# Patient Record
Sex: Female | Born: 1970 | Race: White | Hispanic: No | Marital: Married | Smoking: Former smoker
Health system: Southern US, Community
[De-identification: ages and names within clinical notes are randomized; demographics above are authoritative.]

## PROBLEM LIST (undated history)

## (undated) DIAGNOSIS — I10 Essential (primary) hypertension: Secondary | ICD-10-CM

## (undated) HISTORY — DX: Essential (primary) hypertension: I10

## (undated) HISTORY — PX: OTHER SURGICAL HISTORY: SHX169

---

## 2003-09-17 ENCOUNTER — Other Ambulatory Visit: Admission: RE | Admit: 2003-09-17 | Discharge: 2003-09-17 | Payer: Self-pay | Admitting: Obstetrics and Gynecology

## 2004-12-17 ENCOUNTER — Other Ambulatory Visit: Admission: RE | Admit: 2004-12-17 | Discharge: 2004-12-17 | Payer: Self-pay | Admitting: Obstetrics and Gynecology

## 2010-04-13 ENCOUNTER — Encounter: Admission: RE | Admit: 2010-04-13 | Discharge: 2010-04-13 | Payer: Self-pay | Admitting: Family Medicine

## 2012-11-27 ENCOUNTER — Other Ambulatory Visit: Payer: Self-pay | Admitting: Family Medicine

## 2012-11-27 DIAGNOSIS — Z1231 Encounter for screening mammogram for malignant neoplasm of breast: Secondary | ICD-10-CM

## 2012-12-12 ENCOUNTER — Ambulatory Visit
Admission: RE | Admit: 2012-12-12 | Discharge: 2012-12-12 | Disposition: A | Discharge status: Home / Self Care | Payer: PRIVATE HEALTH INSURANCE | Source: Ambulatory Visit | Attending: Family Medicine | Admitting: Family Medicine

## 2012-12-12 DIAGNOSIS — Z1231 Encounter for screening mammogram for malignant neoplasm of breast: Secondary | ICD-10-CM

## 2013-11-22 ENCOUNTER — Encounter: Payer: Self-pay | Admitting: Internal Medicine

## 2013-11-22 NOTE — Progress Notes (Signed)
This encounter was created in error - please disregard.

## 2017-05-24 ENCOUNTER — Ambulatory Visit: Payer: PRIVATE HEALTH INSURANCE | Admitting: Cardiovascular Disease

## 2017-05-24 ENCOUNTER — Other Ambulatory Visit: Payer: Self-pay | Admitting: Registered Nurse

## 2017-05-24 ENCOUNTER — Telehealth: Payer: Self-pay | Admitting: Student

## 2017-05-24 DIAGNOSIS — Z1231 Encounter for screening mammogram for malignant neoplasm of breast: Secondary | ICD-10-CM

## 2017-05-24 NOTE — Telephone Encounter (Signed)
Received records from [facility] ([place] for appointment on 06/07/17 with Randall An, PA.  Records put with Brittany's schedule for 06/07/17. lp

## 2017-05-30 ENCOUNTER — Encounter: Payer: Self-pay | Admitting: *Deleted

## 2017-06-06 ENCOUNTER — Ambulatory Visit
Admission: RE | Admit: 2017-06-06 | Discharge: 2017-06-06 | Disposition: A | Discharge status: Home / Self Care | Payer: BLUE CROSS/BLUE SHIELD | Source: Ambulatory Visit | Attending: Registered Nurse | Admitting: Registered Nurse

## 2017-06-06 DIAGNOSIS — Z1231 Encounter for screening mammogram for malignant neoplasm of breast: Secondary | ICD-10-CM

## 2017-06-06 NOTE — Progress Notes (Deleted)
Cardiology Office Note    Date:  06/06/2017   ID:  Carly Diaz, DOB Nov 29, 1970, MRN [id number]  PCP:  Merri Brunette, MD  Cardiologist: New to [facility] - Dr. Marland Kitchen  No chief complaint on file.   History of Present Illness:    Carly Diaz is a 46 y.o. female with past medical history of *** who presents to the office today for evaluation of *** at the request of ***.     No past medical history on file.  No past surgical history on file.  Current Medications: Outpatient Medications Prior to Visit  Medication Sig Dispense Refill  . Apple Cider Vinegar 600 MG CAPS Take 600 mg by mouth daily.    . Biotin 1000 MCG tablet Take 1,000 mcg by mouth daily.    . Cinnamon 500 MG capsule Take 500 mg by mouth daily.    . diphenhydrAMINE (BENADRYL) 25 MG tablet Take 25 mg by mouth every 6 (six) hours as needed.    Marland Kitchen ibuprofen (ADVIL,MOTRIN) 200 MG tablet Take 200 mg by mouth every 6 (six) hours as needed for fever, headache, mild pain, moderate pain or cramping.     No facility-administered medications prior to visit.      Allergies:   Aspirin   Social History   Social History  . Marital status: Unknown    Spouse name: N/A  . Number of children: N/A  . Years of education: N/A   Social History Main Topics  . Smoking status: Not on file  . Smokeless tobacco: Never Used  . Alcohol use Not on file  . Drug use: Unknown  . Sexual activity: Not on file   Other Topics Concern  . Not on file   Social History Narrative  . No narrative on file     Family History:  The patient's ***family history includes Heart attack in her father; Heart disease in her father.   Review of Systems:   Please see the history of present illness.     General:  No chills, fever, night sweats or weight changes.  Cardiovascular:  No chest pain, dyspnea on exertion, edema, orthopnea, palpitations, paroxysmal nocturnal dyspnea. Dermatological: No rash, lesions/masses Respiratory: No cough,  dyspnea Urologic: No hematuria, dysuria Abdominal:   No nausea, vomiting, diarrhea, bright red blood per rectum, melena, or hematemesis Neurologic:  No visual changes, wkns, changes in mental status. All other systems reviewed and are otherwise negative except as noted above.   Physical Exam:    VS:  There were no vitals taken for this visit.   General: Well developed, well nourished,female appearing in no acute distress. Head: Normocephalic, atraumatic, sclera non-icteric, no xanthomas, nares are without discharge.  Neck: No carotid bruits. JVD not elevated.  Lungs: Respirations regular and unlabored, without wheezes or rales.  Heart: ***Regular rate and rhythm. No S3 or S4.  No murmur, no rubs, or gallops appreciated. Abdomen: Soft, non-tender, non-distended with normoactive bowel sounds. No hepatomegaly. No rebound/guarding. No obvious abdominal masses. Msk:  Strength and tone appear normal for age. No joint deformities or effusions. Extremities: No clubbing or cyanosis. No edema.  Distal pedal pulses are 2+ bilaterally. Neuro: Alert and oriented X 3. Moves all extremities spontaneously. No focal deficits noted. Psych:  Responds to questions appropriately with a normal affect. Skin: No rashes or lesions noted  Wt Readings from Last 3 Encounters:  No data found for Wt        Studies/Labs Reviewed:   EKG:  EKG is*** ordered today.  The ekg ordered today demonstrates ***  Recent Labs: No results found for requested labs within last 8760 hours.   Lipid Panel No results found for: CHOL, TRIG, HDL, CHOLHDL, VLDL, LDLCALC, LDLDIRECT  Additional studies/ records that were reviewed today include:  ***  Assessment:    No diagnosis found.   Plan:   In order of problems listed above:  1. ***    Medication Adjustments/Labs and Tests Ordered: Current medicines are reviewed at length with the patient today.  Concerns regarding medicines are outlined above.  Medication  changes, Labs and Tests ordered today are listed in the Patient Instructions below. There are no Patient Instructions on file for this visit.   Signed, Ellsworth Lennox, PA-C  06/06/2017 9:25 PM    [facility] [address] [place], [address] [place], [place]  [address] Phone: [phone]; Fax: [phone]  [address], [address] [place], [place] [address] Phone: [phone]

## 2017-06-07 ENCOUNTER — Ambulatory Visit: Payer: PRIVATE HEALTH INSURANCE | Admitting: Student

## 2017-07-20 ENCOUNTER — Telehealth: Payer: Self-pay | Admitting: Cardiology

## 2017-07-20 NOTE — Telephone Encounter (Signed)
[facility] for upcoming appointment on 08/03/17 @ 9:40am with Dr. Herbie BaltimoreHarding. [facility]. 07/20/17 ab

## 2017-08-03 ENCOUNTER — Ambulatory Visit: Payer: BLUE CROSS/BLUE SHIELD | Admitting: Cardiology

## 2017-08-29 ENCOUNTER — Ambulatory Visit: Payer: 59 | Admitting: Cardiology

## 2017-08-29 ENCOUNTER — Encounter: Payer: Self-pay | Admitting: Cardiology

## 2017-08-29 VITALS — BP 158/102 | HR 67 | Ht 69.0 in | Wt 139.8 lb

## 2017-08-29 DIAGNOSIS — R002 Palpitations: Secondary | ICD-10-CM

## 2017-08-29 DIAGNOSIS — I493 Ventricular premature depolarization: Secondary | ICD-10-CM | POA: Diagnosis not present

## 2017-08-29 DIAGNOSIS — I10 Essential (primary) hypertension: Secondary | ICD-10-CM | POA: Diagnosis not present

## 2017-08-29 DIAGNOSIS — Z8249 Family history of ischemic heart disease and other diseases of the circulatory system: Secondary | ICD-10-CM

## 2017-08-29 DIAGNOSIS — R012 Other cardiac sounds: Secondary | ICD-10-CM

## 2017-08-29 NOTE — Progress Notes (Signed)
PCP: Merri BrunettePharr, Walter, MD; Lauretta ChesterMary Prevost, NP  Clinic Note: Chief Complaint  Patient presents with  . New Patient (Initial Visit)    Family history of CAD    HPI: Carly Diaz is a 47 y.o. female who is being seen today for the evaluation of cardiovascular risk at the request of Lauretta ChesterMary Prevost, NP /  Merri BrunettePharr, Walter, MD.  EKG from Dr. Carolee RotaPharr's office May 20, 2017 -- showed sinus rhythm with frequent PVCs.  Unifocal.  Cannot exclude right atrial enlargement.  Delayed R wave progression with nonspecific ST changes.  Carly Diaz was seen back in Nov by Lauretta ChesterMary Prevost, NP -- doing well, but concern re abnormal EKG & fam Hx CAD.   Recent Hospitalizations: None  Studies Personally Reviewed - (if available, images/films reviewed: From Epic Chart or Care Everywhere)  None  Interval History: Carly Diaz presents here today basically to discuss her strong family history of heart disease with her father having an MI at roughly this age resulting in sudden cardiac death.  She also notes that he was a heavy smoker, and she does not smoke.  He was also overweight and not reactive.  She is quite active. She carries a diagnosis of hypertension and is somewhat hypertensive today.  She takes metoprolol succinate but no other medications besides over-the-counter meds. From a cardiac standpoint overall she is quite stable relatively active.  She denies any chest tightness or pressure with rest or exertion.  No exertional dyspnea.  No PND, orthopnea or edema. No rapid irregular heartbeats or palpitations.  No lightheadedness, dizziness, weakness or syncope/near syncope. No TIA/amaurosis fugax symptoms. No melena, hematochezia, hematuria, or epstaxis. No claudication.  ROS: A comprehensive was performed. Review of Systems  Constitutional: Negative for malaise/fatigue.  HENT: Negative for congestion and nosebleeds.   Respiratory: Negative for cough and wheezing.   Gastrointestinal: Negative for  abdominal pain, blood in stool and melena.  Genitourinary: Negative for dysuria and hematuria.  Musculoskeletal: Negative for falls and joint pain.  Skin: Negative.   Neurological: Negative for dizziness.  Endo/Heme/Allergies: Negative for environmental allergies. Does not bruise/bleed easily.  Psychiatric/Behavioral: Negative for depression and memory loss. The patient is not nervous/anxious and does not have insomnia.   All other systems reviewed and are negative.   I have reviewed and (if needed) personally updated the patient's problem list, medications, allergies, past medical and surgical history, social and family history.   Past Medical History:  Diagnosis Date  . Essential hypertension     Past Surgical History:  Procedure Laterality Date  . None      Current Meds  Medication Sig  . Apple Cider Vinegar 600 MG CAPS Take 600 mg by mouth daily.  . Biotin 1000 MCG tablet Take 1,000 mcg by mouth daily.  . Cinnamon 500 MG capsule Take 500 mg by mouth daily.  . diphenhydrAMINE (BENADRYL) 25 MG tablet Take 25 mg by mouth every 6 (six) hours as needed.  . Doxylamine Succinate, Sleep, (SLEEP AID PO) Take 25 mg by mouth at bedtime.  Marland Kitchen. ibuprofen (ADVIL,MOTRIN) 200 MG tablet Take 200 mg by mouth every 6 (six) hours as needed for fever, headache, mild pain, moderate pain or cramping.  . Metoprolol Succinate 50 MG CS24 Take 1 tablet by mouth daily.    Allergies  Allergen Reactions  . Aspirin     GI upset    Social History   Tobacco Use  . Smoking status: Former Games developermoker  . Smokeless tobacco: Never Used  Substance Use Topics  . Alcohol use: Yes    Alcohol/week: 2.4 - 3.0 oz    Types: 4 - 5 Cans of beer per week    Comment: 3 days a week  . Drug use: No   Social History   Social History Narrative  . Not on file    family history includes Heart attack (age of onset: 44) in her father; Heart disease in her father.  Wt Readings from Last 3 Encounters:  08/29/17 139 lb  12.8 oz (63.4 kg)    PHYSICAL EXAM BP (!) 158/102   Pulse 67   Ht 5\' 9"  (1.753 m)   Wt 139 lb 12.8 oz (63.4 kg)   BMI 20.64 kg/m  Physical Exam  Constitutional: She is oriented to person, place, and time. She appears well-developed and well-nourished. No distress.  Very healthy-appearing.  Neck: Normal range of motion. Neck supple. No hepatojugular reflux and no JVD present. Carotid bruit is not present.  Cardiovascular: Normal rate, regular rhythm, S1 normal and intact distal pulses.  No extrasystoles are present. PMI is not displaced. Exam reveals no gallop and no friction rub.  No murmur heard. Split S2 versus opening snap..  Pulmonary/Chest: Effort normal and breath sounds normal. No respiratory distress. She has no wheezes. She has no rales.  Abdominal: Soft. Bowel sounds are normal. She exhibits no distension. There is no tenderness. There is no rebound.  Musculoskeletal: Normal range of motion. She exhibits no edema.  Neurological: She is alert and oriented to person, place, and time.  Skin: Skin is warm and dry. No erythema.  Psychiatric: She has a normal mood and affect. Her behavior is normal. Judgment and thought content normal.  Nursing note and vitals reviewed.    Adult ECG Report  Rate: 67;  Rhythm: normal sinus rhythm and Normal axis, intervals and durations.  Relatively normal R wave progression on current EKG.;   Narrative Interpretation: Stable EKG.   Other studies Reviewed: Additional studies/ records that were reviewed today include:  Recent Labs: Labs reviewed by PCPs note.  See scanned results.  (Addendum below)  ASSESSMENT / PLAN:  Carly Diaz presents here today for baseline cardiovascular evaluation.  She has a strong family history with her father having a relatively early age.  Thankfully, the only real risk factors she has not, and other than family history is hypertension and being a former smoker.   Her blood pressures not very well controlled, however  she is quite anxious today and I suspect that that is probably the reason why her blood pressure is higher.  At her PCPs office the pressure was not nearly as high being 140/76, but still not quite at goal. Her lipids are fairly well controlled for risk based on PCPs labs with an LDL of 60.  She had some irregular heart sounds on exam, so we will check a 2D echocardiogram.  Otherwise for baseline cardiovascular risk stratification, we will check a coronary calcium score to assess for extent of coronary artery disease.  This will help [place] know how aggressive we need to do with risk factor modification.  She did have some PVCs and occasional palpitations.  However the not very significant.  At this point I do not think we need to monitor.  She is on a beta-blocker and well-controlled.  Problem List Items Addressed This Visit    Abnormal heart sounds   Relevant Orders   CT CARDIAC SCORING   ECHOCARDIOGRAM COMPLETE   Essential hypertension (Chronic)  Family history of premature coronary artery disease - Primary (Chronic)   Relevant Orders   CT CARDIAC SCORING   ECHOCARDIOGRAM COMPLETE   PVC's (premature ventricular contractions)   Relevant Orders   CT CARDIAC SCORING   ECHOCARDIOGRAM COMPLETE    Other Visit Diagnoses    Palpitations       Relevant Orders   CT CARDIAC SCORING      Current medicines are reviewed at length with the patient today. (+/- concerns) none The following changes have been made:None  Patient Instructions  NO MEDICATION CHANGES   Your physician recommends that you schedule a follow-up appointment in 1 month with DR HARDING.    SCHEDULE AT [address] Your physician has requested that you have an echocardiogram. Echocardiography is a painless test that uses sound waves to create images of your heart. It provides your doctor with information about the size and shape of your heart and how well your heart's chambers and valves are working.  This procedure takes approximately one hour. There are no restrictions for this procedure.    has ordered a CT coronary calcium score. This test is done at [address].This test is not covered by insurance, There is a $150  cost.   Coronary CalciumScan A coronary calcium scan is an imaging test used to look for deposits of calcium and other fatty materials (plaques) in the inner lining of the blood vessels of the heart (coronary arteries). These deposits of calcium and plaques can partly clog and narrow the coronary arteries without producing any symptoms or warning signs. This puts a person at risk for a heart attack. This test can detect these deposits before symptoms develop. Tell a health care provider about:  Any allergies you have.  All medicines you are taking, including vitamins, herbs, eye drops, creams, and over-the-counter medicines.  Any problems you or family members have had with anesthetic medicines.  Any blood disorders you have.  Any surgeries you have had.  Any medical conditions you have.  Whether you are pregnant or may be pregnant. What are the risks? Generally, this is a safe procedure. However, problems may occur, including:  Harm to a pregnant woman and her unborn baby. This test involves the use of radiation. Radiation exposure can be dangerous to a pregnant woman and her unborn baby. If you are pregnant, you generally should not have this procedure done.  Slight increase in the risk of cancer. This is because of the radiation involved in the test. What happens before the procedure? No preparation is needed for this procedure. What happens during the procedure?  You will undress and remove any jewelry around your neck or chest.  You will put on a hospital gown.  Sticky electrodes will be placed on your chest. The electrodes will be connected to an electrocardiogram (ECG) machine to record a tracing of the electrical activity of your  heart.  A CT scanner will take pictures of your heart. During this time, you will be asked to lie still and hold your breath for 2-3 seconds while a picture of your heart is being taken. The procedure may vary among health care providers and hospitals. What happens after the procedure?  You can get dressed.  You can return to your normal activities.  It is up to you to get the results of your test. Ask your health care provider, or the department that is doing the test, when your results will be  ready. Summary  A coronary calcium scan is an imaging test used to look for deposits of calcium and other fatty materials (plaques) in the inner lining of the blood vessels of the heart (coronary arteries).  Generally, this is a safe procedure. Tell your health care provider if you are pregnant or may be pregnant.  No preparation is needed for this procedure.  A CT scanner will take pictures of your heart.  You can return to your normal activities after the scan is done. This information is not intended to replace advice given to you by your health care provider. Make sure you discuss any questions you have with your health care provider. Document Released: 02/05/2008 Document Revised: 06/28/2016 Document Reviewed: 06/28/2016 Elsevier Interactive Patient Education  2017 [employer].     Studies Ordered:   Orders Placed This Encounter  Procedures  . CT CARDIAC SCORING  . ECHOCARDIOGRAM COMPLETE      Bryan Lemma, M.D., M.S. Interventional Cardiologist   Pager # [phone] Phone # [phone] [address] [place], [place] [address]   Thank you for choosing Heartcare at [facility]!!

## 2017-08-29 NOTE — Patient Instructions (Addendum)
NO MEDICATION CHANGES   Your physician recommends that you schedule a follow-up appointment in 1 month with DR HARDING.    SCHEDULE AT [address] Your physician has requested that you have an echocardiogram. Echocardiography is a painless test that uses sound waves to create images of your heart. It provides your doctor with information about the size and shape of your heart and how well your heart's chambers and valves are working. This procedure takes approximately one hour. There are no restrictions for this procedure.    has ordered a CT coronary calcium score. This test is done at [address].This test is not covered by insurance, There is a $150  cost.   Coronary CalciumScan A coronary calcium scan is an imaging test used to look for deposits of calcium and other fatty materials (plaques) in the inner lining of the blood vessels of the heart (coronary arteries). These deposits of calcium and plaques can partly clog and narrow the coronary arteries without producing any symptoms or warning signs. This puts a person at risk for a heart attack. This test can detect these deposits before symptoms develop. Tell a health care provider about:  Any allergies you have.  All medicines you are taking, including vitamins, herbs, eye drops, creams, and over-the-counter medicines.  Any problems you or family members have had with anesthetic medicines.  Any blood disorders you have.  Any surgeries you have had.  Any medical conditions you have.  Whether you are pregnant or may be pregnant. What are the risks? Generally, this is a safe procedure. However, problems may occur, including:  Harm to a pregnant woman and her unborn baby. This test involves the use of radiation. Radiation exposure can be dangerous to a pregnant woman and her unborn baby. If you are pregnant, you generally should not have this procedure done.  Slight increase in the risk of  cancer. This is because of the radiation involved in the test. What happens before the procedure? No preparation is needed for this procedure. What happens during the procedure?  You will undress and remove any jewelry around your neck or chest.  You will put on a hospital gown.  Sticky electrodes will be placed on your chest. The electrodes will be connected to an electrocardiogram (ECG) machine to record a tracing of the electrical activity of your heart.  A CT scanner will take pictures of your heart. During this time, you will be asked to lie still and hold your breath for 2-3 seconds while a picture of your heart is being taken. The procedure may vary among health care providers and hospitals. What happens after the procedure?  You can get dressed.  You can return to your normal activities.  It is up to you to get the results of your test. Ask your health care provider, or the department that is doing the test, when your results will be ready. Summary  A coronary calcium scan is an imaging test used to look for deposits of calcium and other fatty materials (plaques) in the inner lining of the blood vessels of the heart (coronary arteries).  Generally, this is a safe procedure. Tell your health care provider if you are pregnant or may be pregnant.  No preparation is needed for this procedure.  A CT scanner will take pictures of your heart.  You can return to your normal activities after the scan is done. This information is not intended to replace  advice given to you by your health care provider. Make sure you discuss any questions you have with your health care provider. Document Released: 02/05/2008 Document Revised: 06/28/2016 Document Reviewed: 06/28/2016 Elsevier Interactive Patient Education  2017 [employer].

## 2017-09-01 ENCOUNTER — Encounter: Payer: Self-pay | Admitting: Cardiology

## 2017-09-05 NOTE — Addendum Note (Signed)
Addended by: Barrie DunkerHOMAS, NYASHA N on: 09/05/2017 07:56 AM   Modules accepted: Orders

## 2017-09-13 ENCOUNTER — Other Ambulatory Visit: Payer: Self-pay

## 2017-09-13 ENCOUNTER — Ambulatory Visit (HOSPITAL_COMMUNITY): Payer: 59 | Attending: Cardiology

## 2017-09-13 ENCOUNTER — Ambulatory Visit (INDEPENDENT_AMBULATORY_CARE_PROVIDER_SITE_OTHER)
Admission: RE | Admit: 2017-09-13 | Discharge: 2017-09-13 | Disposition: A | Discharge status: Home / Self Care | Payer: Self-pay | Source: Ambulatory Visit | Attending: Cardiology | Admitting: Cardiology

## 2017-09-13 DIAGNOSIS — R012 Other cardiac sounds: Secondary | ICD-10-CM | POA: Diagnosis not present

## 2017-09-13 DIAGNOSIS — I340 Nonrheumatic mitral (valve) insufficiency: Secondary | ICD-10-CM | POA: Diagnosis not present

## 2017-09-13 DIAGNOSIS — I493 Ventricular premature depolarization: Secondary | ICD-10-CM | POA: Diagnosis not present

## 2017-09-13 DIAGNOSIS — Z8249 Family history of ischemic heart disease and other diseases of the circulatory system: Secondary | ICD-10-CM

## 2017-09-13 DIAGNOSIS — R002 Palpitations: Secondary | ICD-10-CM

## 2017-09-14 NOTE — Progress Notes (Signed)
Echo results: Good news: Essentially normal echocardiogram and normal pump function and normal valve function.   EF: 55-60%. No regional wall motion abnormalities = No signs to suggest prior heart attack.Carly Diaz.  Carly Hershberger, MD

## 2017-09-29 ENCOUNTER — Ambulatory Visit: Payer: 59 | Admitting: Cardiology

## 2018-01-31 ENCOUNTER — Other Ambulatory Visit: Payer: 59

## 2018-05-17 IMAGING — MG 2D DIGITAL SCREENING BILATERAL MAMMOGRAM WITH CAD AND ADJUNCT TO
9 of 13 series · 9 of 29 positions shown · non-contrast
Comparison: Previous exam(s).

CLINICAL DATA: Screening.

EXAM:
2D DIGITAL SCREENING BILATERAL MAMMOGRAM WITH CAD AND ADJUNCT TOMO

[L MLO (1 of 2)]
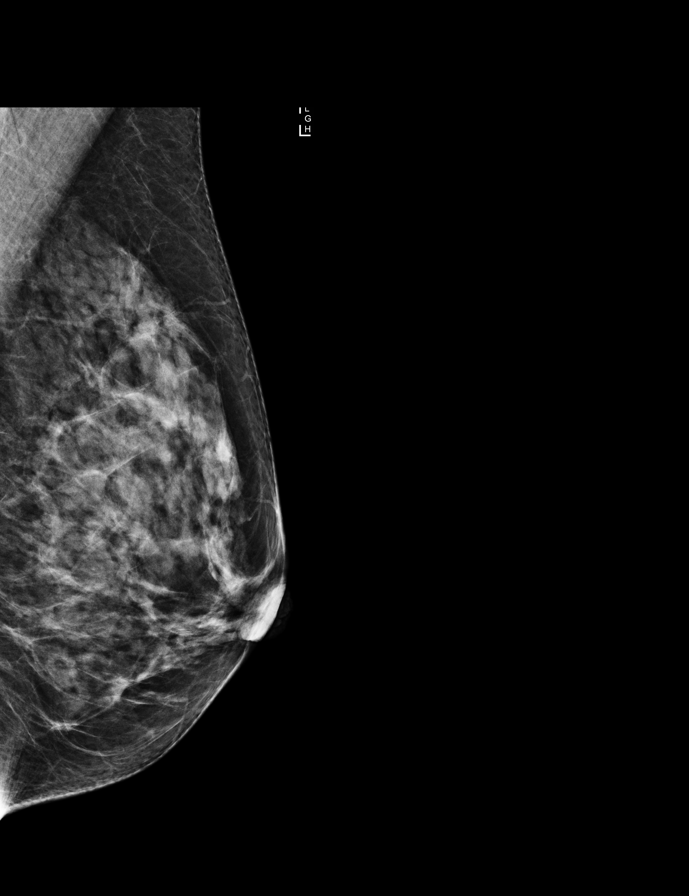

[R MLO]
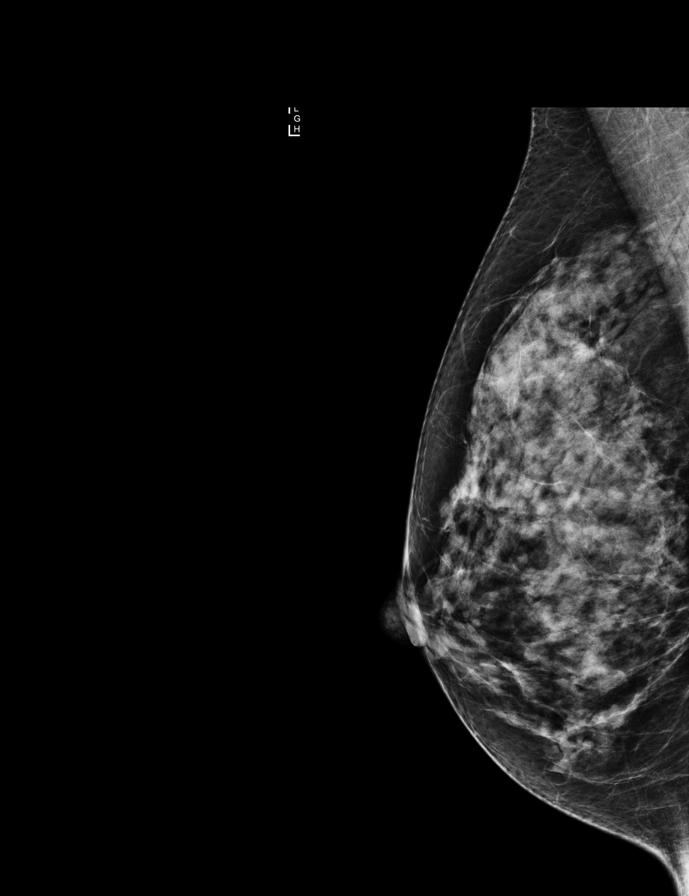

[L MLO synth-2D]
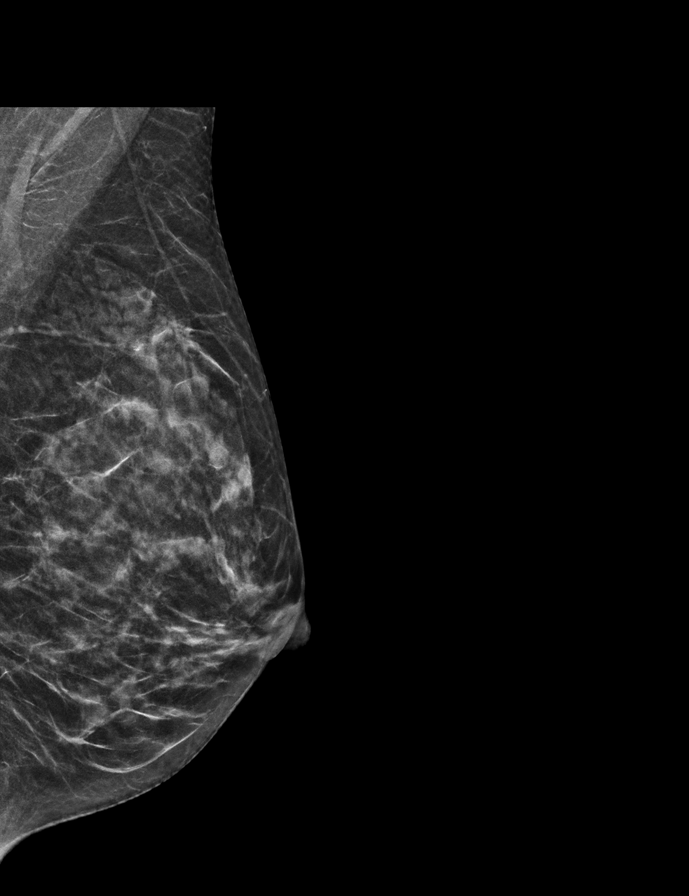

[L CC synth-2D]
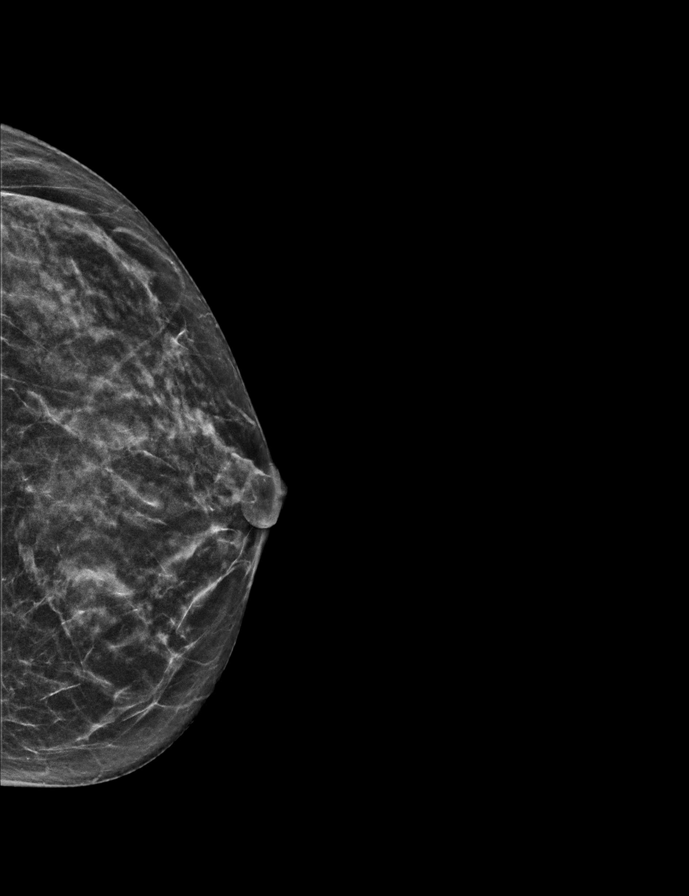

[R CC synth-2D]
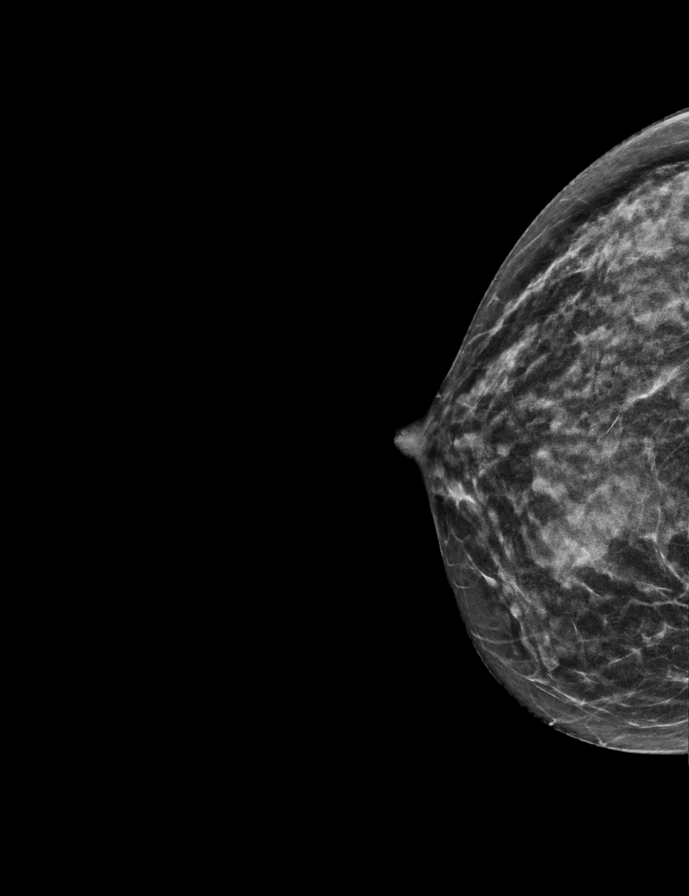

[L CC]
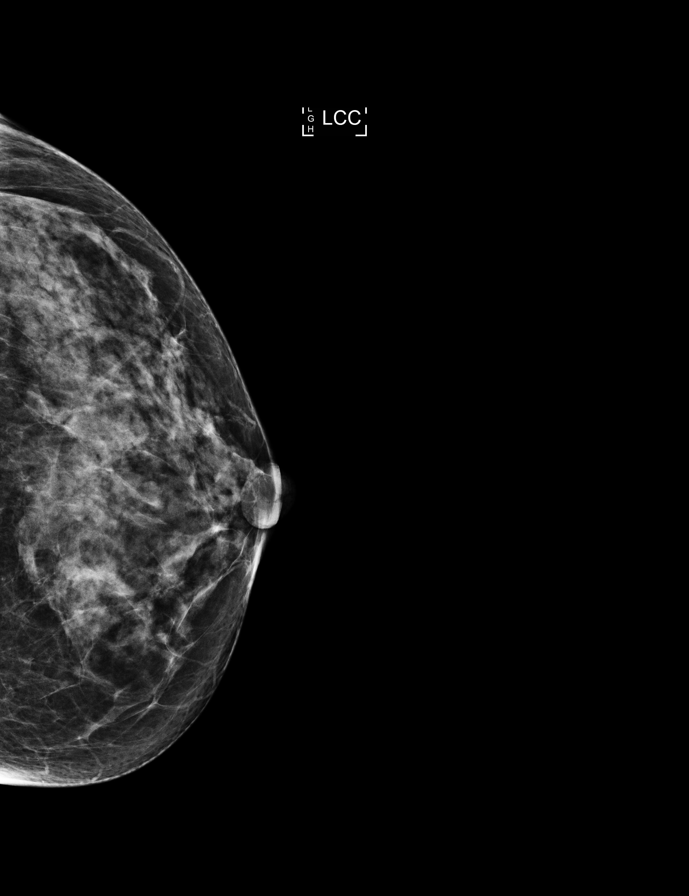

[R MLO synth-2D]
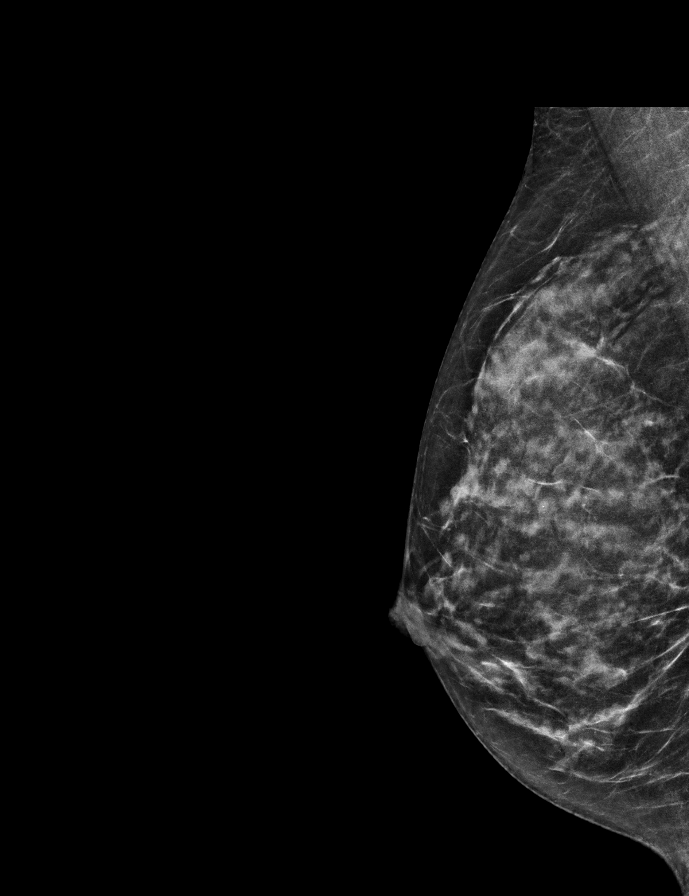

[L MLO (2 of 2)]
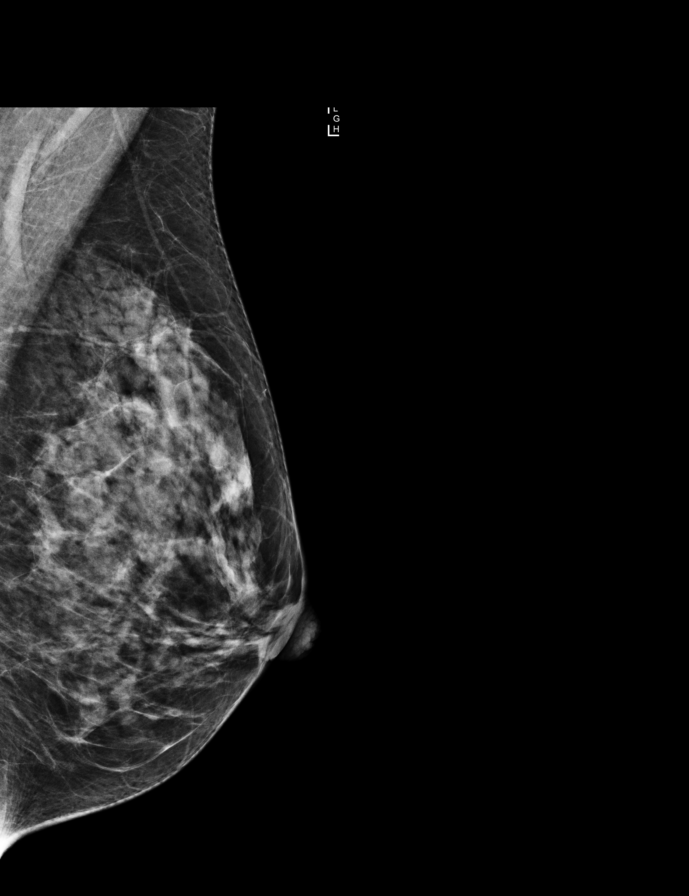

[R CC]
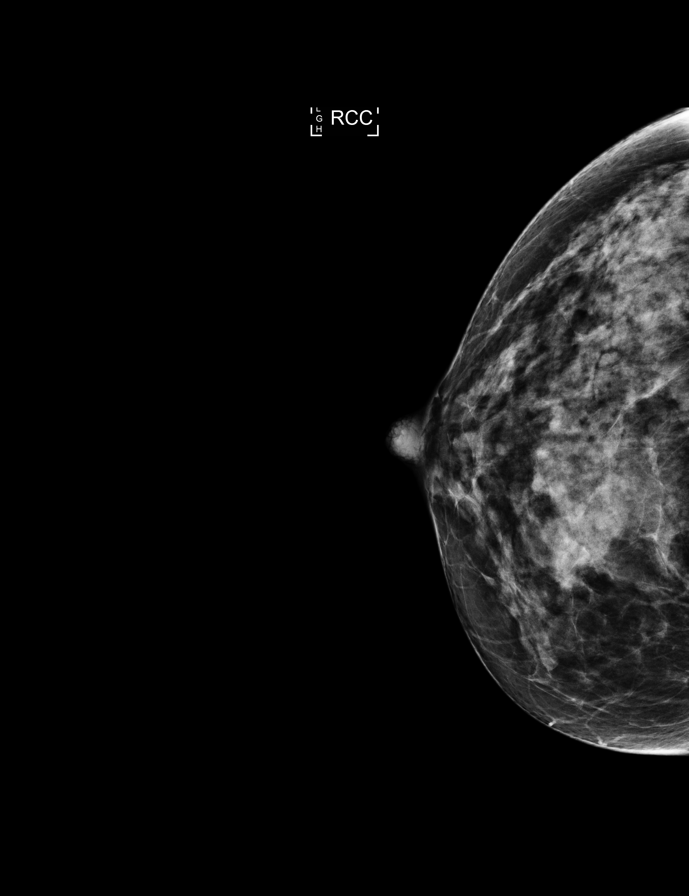

[9 of 29 positions shown; findings below may reference images not displayed]

ACR Breast Density Category d: The breast tissue is extremely dense,
which lowers the sensitivity of mammography.
FINDINGS: There are no findings suspicious for malignancy. Images were
processed with CAD.
IMPRESSION: No mammographic evidence of malignancy. A result letter of this
screening mammogram will be mailed directly to the patient.

RECOMMENDATION:
Screening mammogram in one year. (Code:US-D-RZ7)

BI-RADS CATEGORY  1: Negative.

## 2018-06-06 ENCOUNTER — Other Ambulatory Visit: Payer: Self-pay | Admitting: Registered Nurse

## 2018-06-06 DIAGNOSIS — Z1231 Encounter for screening mammogram for malignant neoplasm of breast: Secondary | ICD-10-CM

## 2018-07-10 ENCOUNTER — Ambulatory Visit
Admission: RE | Admit: 2018-07-10 | Discharge: 2018-07-10 | Disposition: A | Discharge status: Home / Self Care | Payer: 59 | Source: Ambulatory Visit | Attending: Registered Nurse | Admitting: Registered Nurse

## 2018-07-10 DIAGNOSIS — Z1231 Encounter for screening mammogram for malignant neoplasm of breast: Secondary | ICD-10-CM

## 2019-07-09 ENCOUNTER — Other Ambulatory Visit: Payer: Self-pay

## 2019-07-09 DIAGNOSIS — Z20822 Contact with and (suspected) exposure to covid-19: Secondary | ICD-10-CM

## 2019-07-11 LAB — NOVEL CORONAVIRUS, NAA: SARS-CoV-2, NAA: NOT DETECTED

## 2020-06-17 ENCOUNTER — Other Ambulatory Visit: Payer: Self-pay | Admitting: Registered Nurse

## 2020-06-17 DIAGNOSIS — Z1231 Encounter for screening mammogram for malignant neoplasm of breast: Secondary | ICD-10-CM

## 2020-07-23 ENCOUNTER — Ambulatory Visit
Admission: RE | Admit: 2020-07-23 | Discharge: 2020-07-23 | Disposition: A | Discharge status: Home / Self Care | Payer: 59 | Source: Ambulatory Visit | Attending: Registered Nurse | Admitting: Registered Nurse

## 2020-07-23 ENCOUNTER — Other Ambulatory Visit: Payer: Self-pay

## 2020-07-23 DIAGNOSIS — Z1231 Encounter for screening mammogram for malignant neoplasm of breast: Secondary | ICD-10-CM

## 2023-04-12 ENCOUNTER — Other Ambulatory Visit: Payer: Self-pay | Admitting: Registered Nurse

## 2023-04-12 DIAGNOSIS — Z1231 Encounter for screening mammogram for malignant neoplasm of breast: Secondary | ICD-10-CM

## 2023-04-13 ENCOUNTER — Ambulatory Visit: Payer: 59

## 2023-04-13 DIAGNOSIS — Z1231 Encounter for screening mammogram for malignant neoplasm of breast: Secondary | ICD-10-CM
